# Patient Record
Sex: Male | Born: 1975 | Race: Black or African American | Hispanic: No | Marital: Married | Smoking: Current every day smoker
Health system: Southern US, Community
[De-identification: ages and names within clinical notes are randomized; demographics above are authoritative.]

## PROBLEM LIST (undated history)

## (undated) DIAGNOSIS — I10 Essential (primary) hypertension: Secondary | ICD-10-CM

## (undated) DIAGNOSIS — E119 Type 2 diabetes mellitus without complications: Secondary | ICD-10-CM

---

## 2006-12-20 ENCOUNTER — Emergency Department (HOSPITAL_COMMUNITY): Admission: EM | Admit: 2006-12-20 | Discharge: 2006-12-20 | Payer: Self-pay | Admitting: Emergency Medicine

## 2019-08-17 ENCOUNTER — Emergency Department

## 2019-08-17 ENCOUNTER — Emergency Department
Admission: EM | Admit: 2019-08-17 | Discharge: 2019-08-17 | Disposition: A | Discharge status: Home / Self Care | Attending: Emergency Medicine | Admitting: Emergency Medicine

## 2019-08-17 ENCOUNTER — Encounter: Payer: Self-pay | Admitting: Emergency Medicine

## 2019-08-17 ENCOUNTER — Other Ambulatory Visit: Payer: Self-pay

## 2019-08-17 DIAGNOSIS — Y9389 Activity, other specified: Secondary | ICD-10-CM | POA: Diagnosis not present

## 2019-08-17 DIAGNOSIS — S40811A Abrasion of right upper arm, initial encounter: Secondary | ICD-10-CM | POA: Diagnosis not present

## 2019-08-17 DIAGNOSIS — Z79899 Other long term (current) drug therapy: Secondary | ICD-10-CM | POA: Insufficient documentation

## 2019-08-17 DIAGNOSIS — E119 Type 2 diabetes mellitus without complications: Secondary | ICD-10-CM | POA: Insufficient documentation

## 2019-08-17 DIAGNOSIS — S3992XA Unspecified injury of lower back, initial encounter: Secondary | ICD-10-CM | POA: Diagnosis present

## 2019-08-17 DIAGNOSIS — S300XXA Contusion of lower back and pelvis, initial encounter: Secondary | ICD-10-CM

## 2019-08-17 DIAGNOSIS — Y999 Unspecified external cause status: Secondary | ICD-10-CM | POA: Insufficient documentation

## 2019-08-17 DIAGNOSIS — Y92410 Unspecified street and highway as the place of occurrence of the external cause: Secondary | ICD-10-CM | POA: Insufficient documentation

## 2019-08-17 DIAGNOSIS — I10 Essential (primary) hypertension: Secondary | ICD-10-CM | POA: Insufficient documentation

## 2019-08-17 HISTORY — DX: Type 2 diabetes mellitus without complications: E11.9

## 2019-08-17 HISTORY — DX: Essential (primary) hypertension: I10
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
## 2019-08-17 NOTE — Discharge Instructions (Addendum)
Your pelvis xray was normal, and your exam was otherwise reassuring today.

## 2019-08-17 NOTE — ED Notes (Signed)
Pt currently in NAD, talking on the phone.
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
